# Patient Record
Sex: Female | Born: 1950 | Race: White | Hispanic: No | Marital: Married | Smoking: Former smoker
Health system: Southern US, Community
[De-identification: ages and names within clinical notes are randomized; demographics above are authoritative.]

## PROBLEM LIST (undated history)

## (undated) DIAGNOSIS — K219 Gastro-esophageal reflux disease without esophagitis: Secondary | ICD-10-CM

## (undated) DIAGNOSIS — N200 Calculus of kidney: Secondary | ICD-10-CM

## (undated) DIAGNOSIS — Z8719 Personal history of other diseases of the digestive system: Secondary | ICD-10-CM

## (undated) DIAGNOSIS — I219 Acute myocardial infarction, unspecified: Secondary | ICD-10-CM

## (undated) DIAGNOSIS — I2510 Atherosclerotic heart disease of native coronary artery without angina pectoris: Secondary | ICD-10-CM

## (undated) DIAGNOSIS — M1990 Unspecified osteoarthritis, unspecified site: Secondary | ICD-10-CM

## (undated) DIAGNOSIS — G4730 Sleep apnea, unspecified: Secondary | ICD-10-CM

## (undated) DIAGNOSIS — L409 Psoriasis, unspecified: Secondary | ICD-10-CM

## (undated) DIAGNOSIS — E7800 Pure hypercholesterolemia, unspecified: Secondary | ICD-10-CM

## (undated) DIAGNOSIS — E119 Type 2 diabetes mellitus without complications: Secondary | ICD-10-CM

## (undated) DIAGNOSIS — I10 Essential (primary) hypertension: Secondary | ICD-10-CM

## (undated) HISTORY — PX: CYSTECTOMY: SUR359

## (undated) HISTORY — PX: TONSILLECTOMY: SUR1361

---

## 1991-08-02 DIAGNOSIS — I219 Acute myocardial infarction, unspecified: Secondary | ICD-10-CM

## 1991-08-02 HISTORY — DX: Acute myocardial infarction, unspecified: I21.9

## 1991-08-02 HISTORY — PX: CORONARY ARTERY BYPASS GRAFT: SHX141

## 2012-05-15 ENCOUNTER — Encounter (HOSPITAL_COMMUNITY): Payer: Self-pay | Admitting: Pharmacy Technician

## 2012-05-15 ENCOUNTER — Other Ambulatory Visit: Payer: Self-pay

## 2012-05-15 ENCOUNTER — Encounter (HOSPITAL_COMMUNITY): Payer: Self-pay

## 2012-05-15 ENCOUNTER — Encounter (HOSPITAL_COMMUNITY)
Admission: RE | Admit: 2012-05-15 | Discharge: 2012-05-15 | Disposition: A | Discharge status: Home / Self Care | Payer: Self-pay | Source: Ambulatory Visit | Attending: Urology | Admitting: Urology

## 2012-05-15 HISTORY — DX: Atherosclerotic heart disease of native coronary artery without angina pectoris: I25.10

## 2012-05-15 HISTORY — DX: Psoriasis, unspecified: L40.9

## 2012-05-15 HISTORY — DX: Essential (primary) hypertension: I10

## 2012-05-15 HISTORY — DX: Gastro-esophageal reflux disease without esophagitis: K21.9

## 2012-05-15 HISTORY — DX: Acute myocardial infarction, unspecified: I21.9

## 2012-05-15 HISTORY — DX: Unspecified osteoarthritis, unspecified site: M19.90

## 2012-05-15 HISTORY — DX: Personal history of other diseases of the digestive system: Z87.19

## 2012-05-15 HISTORY — DX: Sleep apnea, unspecified: G47.30

## 2012-05-15 HISTORY — DX: Type 2 diabetes mellitus without complications: E11.9

## 2012-05-15 HISTORY — DX: Calculus of kidney: N20.0

## 2012-05-15 HISTORY — DX: Pure hypercholesterolemia, unspecified: E78.00

## 2012-05-15 LAB — BASIC METABOLIC PANEL
BUN: 23 mg/dL (ref 6–23)
CO2: 24 mEq/L (ref 19–32)
GFR calc non Af Amer: 65 mL/min — ABNORMAL LOW (ref >90)
Glucose, Bld: 129 mg/dL — ABNORMAL HIGH (ref 70–99)
Potassium: 4.4 mEq/L (ref 3.5–5.1)

## 2012-05-15 NOTE — Patient Instructions (Addendum)
[place]  05/15/2012   Your procedure is scheduled on:  05/16/2012  Report to [facility] at  76  AM.  Call this number if you have problems the morning of surgery: [phone]   Remember:   Do not eat food:After Midnight.  May have clear liquids:until Midnight .   Take these medicines the morning of surgery with A SIP OF WATER: voltaren,lisinopril,ditropan  Do not wear jewelry, make-up or nail polish.  Do not wear lotions, powders, or perfumes. You may wear deodorant.  Do not shave 48 hours prior to surgery. Men may shave face and neck.  Do not bring valuables to the hospital.  Contacts, dentures or bridgework may not be worn into surgery.  Leave suitcase in the car. After surgery it may be brought to your room.  For patients admitted to the hospital, checkout time is 11:00 AM the day of discharge.   Patients discharged the day of surgery will not be allowed to drive home.  Name and phone number of your driver: family Special Instructions: N/A   Please read over the following fact sheets that you were given: Pain Booklet, Surgical Site Infection Prevention, Anesthesia Post-op Instructions and Care and Recovery After Surgery Lithotripsy for Kidney Stones WHAT ARE KIDNEY STONES? The kidneys filter blood for chemicals the body cannot use. These waste chemicals are eliminated in the urine. They are removed from the body. Under some conditions, these chemicals may become concentrated. When this happens, they form crystals in the urine. When these crystals build up and stick together, stones may form. When these stones block the flow of urine through the urinary tract, they may cause severe pain. The urinary tract is very sensitive to blockage and stretching by the stone. WHAT IS LITHOTRIPSY? Lithotripsy is a treatment that can sometimes help eliminate kidney stones and pain faster. A form of lithotripsy, also known as ESWL (extracorporeal shock wave lithotripsy), is a nonsurgical  procedure that helps your body rid itself of the kidney stone with a minimum amount of pain. EWSL is a method of crushing a kidney stone with shock waves. These shock waves pass through your body. They cause the kidney stones to crumble while still in the urinary tract. It is then easier for the smaller pieces of stone to pass in the urine. Lithotripsy usually takes about an hour. It is done in a hospital, a lithotripsy center, or a mobile unit. It usually does not require an overnight stay. Your caregiver will instruct you on preparation for the procedure. Your caregiver will tell you what to expect afterward. LET YOUR CAREGIVER KNOW ABOUT:  Allergies.  Medicines taken including herbs, eye drops, over the counter medicines (including aspirin, aleve, or motrin for treatment of inflammatory conditions) and creams.  Use of steroids (by mouth or creams).  Previous problems with anesthetics or novocaine.  Possibility of pregnancy, if this applies.  History of blood clots (thrombophlebitis).  History of bleeding or blood problems.  Previous surgery.  Other health problems. RISKS AND COMPLICATIONS Complications of lithotripsy are uncommon, but include the following:  Infection.  Bleeding of the kidney.  Bruising of the kidney or skin.  Obstruction of the ureter (the passageway from the kidney to the bladder).  Failure of the stone to fragment (break apart). PROCEDURE A stent (flexible tube with holes) may be placed in your ureter. The ureter is the tube that transports the urine from the kidneys to the bladder. Your caregiver may place a stent before the  procedure. This will help keep urine flowing from the kidney if the fragments of the stone block the ureter. You may receive an intravenous (IV) line to give you fluids and medicines. These medicines may help you relax or make you sleep. During the procedure, you will lie comfortably on a fluid-filled cushion or in a warm-water bath.  After an x-ray or ultrasound locates your stone, shock waves are aimed at the stone. If you are awake, you may feel a tapping sensation (feeling) as the shock waves pass through your body. If large stone particles remain after treatment, a second procedure may be necessary at a later date. For comfort during the test:  Relax as much as possible.  Try to remain still as much as possible.  Try to follow instructions to speed up the test.  Let your caregiver know if you are uncomfortable, anxious, or in pain. AFTER THE PROCEDURE  After surgery, you will be taken to the recovery area. A nurse will watch and check your progress. Once you're awake, stable, and taking fluids well, you will be allowed to go home as long as there are no problems. You may be prescribed antibiotics (medicines that kill germs) to help prevent infection. You may also be prescribed pain medicine if needed. In a week or two, your doctor may remove your stent, if you have one. Your caregiver will check to see whether or not stone particles remain. PASSING THE STONE It may take anywhere from a day to several weeks for the stone particles to leave your body. During this time, drink at least 8 to 12 eight ounce glasses of water every day. It is normal for your urine to be cloudy or slightly bloody for a few weeks following this procedure. You may even see small pieces of stone in your urine. A slight fever and some pain are also normal. Your caregiver may ask you to strain your urine to collect some stone particles for chemical analysis. If you find particles while straining the urine, save them. Analysis tells you and the caregiver what the stone is made of. Knowing this may help prevent future stones. PREVENTING FUTURE STONES  Drink about 8 to 12, eight-ounce glasses of water every day.  Follow the diet your caregiver recommends.  Take your prescribed medicine.  See your caregiver regularly for checkups. SEEK IMMEDIATE MEDICAL  CARE IF:  You develop an oral temperature above 102 F (38.9 C), or as your caregiver suggests.  Your pain is not relieved by medicine.  You develop nausea (feeling sick to your stomach) and vomiting.  You develop heavy bleeding.  You have difficulty urinating. Document Released: 07/15/2000 Document Revised: 10/10/2011 Document Reviewed: 05/09/2008 [facility] Patient Information 2013 [place].

## 2012-05-16 ENCOUNTER — Ambulatory Visit (HOSPITAL_COMMUNITY)
Admission: RE | Admit: 2012-05-16 | Discharge: 2012-05-16 | Disposition: A | Discharge status: Home / Self Care | Payer: Self-pay | Source: Ambulatory Visit | Attending: Urology | Admitting: Urology

## 2012-05-16 ENCOUNTER — Ambulatory Visit (HOSPITAL_COMMUNITY): Payer: Self-pay

## 2012-05-16 ENCOUNTER — Encounter (HOSPITAL_COMMUNITY): Payer: Self-pay | Admitting: *Deleted

## 2012-05-16 ENCOUNTER — Encounter (HOSPITAL_COMMUNITY)
Admission: RE | Disposition: A | Discharge status: Home / Self Care | Payer: Self-pay | Source: Ambulatory Visit | Attending: Urology

## 2012-05-16 DIAGNOSIS — E119 Type 2 diabetes mellitus without complications: Secondary | ICD-10-CM | POA: Insufficient documentation

## 2012-05-16 DIAGNOSIS — N200 Calculus of kidney: Secondary | ICD-10-CM | POA: Insufficient documentation

## 2012-05-16 DIAGNOSIS — I10 Essential (primary) hypertension: Secondary | ICD-10-CM | POA: Insufficient documentation

## 2012-05-16 HISTORY — PX: EXTRACORPOREAL SHOCK WAVE LITHOTRIPSY: SHX1557

## 2012-05-16 LAB — GLUCOSE, CAPILLARY: Glucose-Capillary: 94 mg/dL (ref 70–99)

## 2012-05-16 SURGERY — LITHOTRIPSY, ESWL
Anesthesia: Moderate Sedation | Laterality: Left

## 2012-05-16 MED ORDER — DIPHENHYDRAMINE HCL 25 MG PO CAPS
ORAL_CAPSULE | ORAL | Status: AC
  Filled 2012-05-16: qty 1

## 2012-05-16 MED ORDER — DIAZEPAM 5 MG PO TABS
ORAL_TABLET | ORAL | Status: AC
Start: 2012-05-16 — End: 2012-05-16
  Filled 2012-05-16: qty 2

## 2012-05-16 MED ORDER — DIPHENHYDRAMINE HCL 25 MG PO CAPS
25.0000 mg | ORAL_CAPSULE | Freq: Once | ORAL | Status: AC
  Administered 2012-05-16: 25 mg via ORAL

## 2012-05-16 MED ORDER — DIAZEPAM 5 MG PO TABS
10.0000 mg | ORAL_TABLET | Freq: Once | ORAL | Status: AC
  Administered 2012-05-16: 10 mg via ORAL

## 2012-05-16 SURGICAL SUPPLY — 3 items
CLOTH BEACON ORANGE TIMEOUT ST (SAFETY) IMPLANT
GOWN STRL REIN XL XLG (GOWN DISPOSABLE) IMPLANT
TOWEL OR 17X26 4PK STRL BLUE (TOWEL DISPOSABLE) IMPLANT

## 2012-05-16 NOTE — H&P (Signed)
NAME:  Jasmine Hansen, Jasmine Hansen NO.:  [id number]  MEDICAL RECORD NO.:  [id number]  LOCATION:                                 FACILITY:  PHYSICIAN:  Marissa Nestle, M.D.DATE OF BIRTH:  21-Sep-1950  DATE OF ADMISSION:  05/16/2012 DATE OF DISCHARGE:  LH                             HISTORY & PHYSICAL   CHIEF COMPLAINT:  Left renal colic.  HISTORY OF PRESENT ILLNESS:  A 61 year old female who was recently seen in [facility] with left renal colic.  She had a large stone almost 2 cm in the left renal pelvis, which was radiolucent, causing no obstruction and she had positive urine culture.  She was treated with antibiotic, sent home.  She is coming as outpatient today to have ESL done.  I will fluoro her and see if we can see the stone.  If not, then we may have to use the contrast to see the stone.  She does have a very small calculus in the lower pole of calix on the right side, which is not causing any obstruction or any pain.  PAST MEDICAL HISTORY:  History of coronary artery disease, non-insulin dependent diabetes, hypertension, hyperlipidemia, and sleep apnea, she is on CPAP, osteoarthritis, and morbid obesity.  PAST SURGICAL HISTORY:  Includes CABG and tonsillectomy.  Had open heart surgery in 1992.  MEDICATIONS:  She is taking lisinopril, metformin, glipizide, Ritalin, aspirin, oxybutynin.  PERSONAL HISTORY:  Denies any use of alcohol or tobacco.  REVIEW OF SYSTEMS:  Unremarkable.  She has chronic urgency and marked stress incontinence.  Otherwise, unremarkable.  PHYSICAL EXAMINATION:  VITAL SIGNS:  Blood pressure is 147/79, temperature is normal. CENTRAL NERVOUS SYSTEM:  No gross neurological deficit. HEAD, NECK, EYES, ENT:  Negative. CHEST:  Clear. HEART:  Regular sinus rhythm. ABDOMEN:  Soft, flat.  Liver, spleen, kidneys not palpable.  No CVA tenderness. PELVIC:  No adnexal mass or tenderness.  IMPRESSION AND PLAN:  Left renal calculus.  A  tiny right renal calculus. ESL, left renal calculus.  No guarantees about the results.  It depends if I can see the stone.  If unable to see the stone, we may have to cancel the surgery.     Marissa Nestle, M.D.     MIJ/MEDQ  D:  05/16/2012  T:  05/16/2012  Job:  [id number]

## 2012-05-16 NOTE — Progress Notes (Signed)
No change in H&P on reexamination. 

## 2012-05-17 ENCOUNTER — Encounter (HOSPITAL_COMMUNITY): Payer: Self-pay | Admitting: Urology

## 2018-09-18 DIAGNOSIS — I5033 Acute on chronic diastolic (congestive) heart failure: Secondary | ICD-10-CM | POA: Insufficient documentation

## 2018-09-18 DIAGNOSIS — N3000 Acute cystitis without hematuria: Secondary | ICD-10-CM | POA: Insufficient documentation

## 2018-09-18 DIAGNOSIS — N179 Acute kidney failure, unspecified: Secondary | ICD-10-CM | POA: Insufficient documentation

## 2018-09-18 DIAGNOSIS — R768 Other specified abnormal immunological findings in serum: Secondary | ICD-10-CM | POA: Insufficient documentation

## 2018-09-18 DIAGNOSIS — R9431 Abnormal electrocardiogram [ECG] [EKG]: Secondary | ICD-10-CM | POA: Insufficient documentation

## 2018-12-10 ENCOUNTER — Other Ambulatory Visit: Payer: Self-pay | Admitting: Orthopedic Surgery

## 2018-12-10 DIAGNOSIS — M545 Low back pain, unspecified: Secondary | ICD-10-CM

## 2018-12-17 ENCOUNTER — Ambulatory Visit
Admission: RE | Admit: 2018-12-17 | Discharge: 2018-12-17 | Disposition: A | Discharge status: Home / Self Care | Payer: Medicare HMO | Source: Ambulatory Visit | Attending: Orthopedic Surgery | Admitting: Orthopedic Surgery

## 2018-12-17 ENCOUNTER — Other Ambulatory Visit: Payer: Self-pay

## 2018-12-17 DIAGNOSIS — M545 Low back pain, unspecified: Secondary | ICD-10-CM

## 2019-01-01 ENCOUNTER — Other Ambulatory Visit: Payer: Self-pay | Admitting: Orthopedic Surgery

## 2019-01-01 DIAGNOSIS — M545 Low back pain, unspecified: Secondary | ICD-10-CM

## 2019-01-15 ENCOUNTER — Other Ambulatory Visit: Payer: Medicare HMO

## 2019-02-19 DIAGNOSIS — I2510 Atherosclerotic heart disease of native coronary artery without angina pectoris: Secondary | ICD-10-CM | POA: Insufficient documentation

## 2019-02-19 DIAGNOSIS — G4733 Obstructive sleep apnea (adult) (pediatric): Secondary | ICD-10-CM | POA: Insufficient documentation

## 2019-02-19 DIAGNOSIS — N184 Chronic kidney disease, stage 4 (severe): Secondary | ICD-10-CM | POA: Insufficient documentation

## 2019-02-19 DIAGNOSIS — I10 Essential (primary) hypertension: Secondary | ICD-10-CM | POA: Insufficient documentation

## 2019-02-19 DIAGNOSIS — I4891 Unspecified atrial fibrillation: Secondary | ICD-10-CM | POA: Insufficient documentation

## 2019-02-19 DIAGNOSIS — E1121 Type 2 diabetes mellitus with diabetic nephropathy: Secondary | ICD-10-CM | POA: Insufficient documentation

## 2019-04-11 ENCOUNTER — Other Ambulatory Visit: Payer: Self-pay | Admitting: Physical Medicine and Rehabilitation

## 2019-04-11 DIAGNOSIS — M545 Low back pain, unspecified: Secondary | ICD-10-CM

## 2019-04-11 DIAGNOSIS — G8929 Other chronic pain: Secondary | ICD-10-CM

## 2019-05-31 ENCOUNTER — Other Ambulatory Visit: Payer: Self-pay | Admitting: Physical Medicine and Rehabilitation

## 2019-05-31 ENCOUNTER — Ambulatory Visit
Admission: RE | Admit: 2019-05-31 | Discharge: 2019-05-31 | Disposition: A | Discharge status: Home / Self Care | Payer: Medicare HMO | Source: Ambulatory Visit | Attending: Physical Medicine and Rehabilitation | Admitting: Physical Medicine and Rehabilitation

## 2019-05-31 ENCOUNTER — Other Ambulatory Visit: Payer: Self-pay

## 2019-05-31 DIAGNOSIS — M545 Low back pain, unspecified: Secondary | ICD-10-CM

## 2019-05-31 DIAGNOSIS — G8929 Other chronic pain: Secondary | ICD-10-CM

## 2019-05-31 MED ORDER — METHYLPREDNISOLONE ACETATE 40 MG/ML INJ SUSP (RADIOLOG
120.0000 mg | Freq: Once | INTRAMUSCULAR | Status: AC
  Administered 2019-05-31: 120 mg via EPIDURAL

## 2019-05-31 MED ORDER — IOPAMIDOL (ISOVUE-M 200) INJECTION 41%
1.0000 mL | Freq: Once | INTRAMUSCULAR | Status: AC
  Administered 2019-05-31: 1 mL via EPIDURAL

## 2019-05-31 NOTE — Discharge Instructions (Signed)

## 2019-07-09 ENCOUNTER — Other Ambulatory Visit: Payer: Self-pay | Admitting: Physical Medicine and Rehabilitation

## 2019-07-09 DIAGNOSIS — M5416 Radiculopathy, lumbar region: Secondary | ICD-10-CM

## 2019-08-28 ENCOUNTER — Other Ambulatory Visit: Payer: Medicare HMO

## 2019-11-28 ENCOUNTER — Other Ambulatory Visit: Payer: Self-pay

## 2019-11-28 ENCOUNTER — Ambulatory Visit
Admission: RE | Admit: 2019-11-28 | Discharge: 2019-11-28 | Disposition: A | Discharge status: Home / Self Care | Payer: Medicare HMO | Source: Ambulatory Visit | Attending: Physical Medicine and Rehabilitation | Admitting: Physical Medicine and Rehabilitation

## 2019-11-28 DIAGNOSIS — M5416 Radiculopathy, lumbar region: Secondary | ICD-10-CM

## 2019-11-28 MED ORDER — IOPAMIDOL (ISOVUE-M 200) INJECTION 41%
1.0000 mL | Freq: Once | INTRAMUSCULAR | Status: AC
  Administered 2019-11-28: 1 mL via EPIDURAL

## 2019-11-28 MED ORDER — METHYLPREDNISOLONE ACETATE 40 MG/ML INJ SUSP (RADIOLOG
120.0000 mg | Freq: Once | INTRAMUSCULAR | Status: AC
  Administered 2019-11-28: 120 mg via EPIDURAL

## 2019-11-28 NOTE — Discharge Instructions (Signed)
Spinal Injection Discharge Instruction Sheet  1. You may resume a regular diet and any medications that you routinely take, including pain medications.  2. No driving the rest of the day of the procedure.  3. Light activity throughout the rest of the day.  Do not do any strenuous work, exercise, bending or lifting.  The day following the procedure, you may resume normal physical activity but you should refrain from exercising or physical therapy for at least three days.   Common Side Effects:   Headaches- take your usual medications as directed by your physician.     Restlessness or inability to sleep- you may have trouble sleeping for the next few days.  Ask your referring physician if you need any medication for sleep if over the counter sleep medications do not help.   Facial flushing or redness- this should subside within a few days.   Increased pain- a temporary increase in pain a day or two following your procedure is not unusual.  Take your pain medication as prescribed by your referring physician.  You may use ice to the injection site as needed.  Please do not use heat for 24 hours.   Leg cramps  Please contact our office at [phone] for the following symptoms:  Fever greater than 100 degrees.  Headaches unresolved with medication after 2-3 days.  Increased swelling, pain, or redness at injection site.  Thank you for visiting our office.   You may resume Warfarin in 12 hours.

## 2020-03-10 ENCOUNTER — Other Ambulatory Visit: Payer: Self-pay | Admitting: Physical Medicine and Rehabilitation

## 2020-03-10 DIAGNOSIS — M5136 Other intervertebral disc degeneration, lumbar region: Secondary | ICD-10-CM

## 2020-04-15 ENCOUNTER — Other Ambulatory Visit: Payer: Self-pay | Admitting: Physical Medicine and Rehabilitation

## 2020-04-15 ENCOUNTER — Ambulatory Visit
Admission: RE | Admit: 2020-04-15 | Discharge: 2020-04-15 | Disposition: A | Discharge status: Home / Self Care | Payer: Medicare HMO | Source: Ambulatory Visit | Attending: Physical Medicine and Rehabilitation | Admitting: Physical Medicine and Rehabilitation

## 2020-04-15 ENCOUNTER — Other Ambulatory Visit: Payer: Self-pay

## 2020-04-15 DIAGNOSIS — M5136 Other intervertebral disc degeneration, lumbar region: Secondary | ICD-10-CM

## 2020-04-15 DIAGNOSIS — M51369 Other intervertebral disc degeneration, lumbar region without mention of lumbar back pain or lower extremity pain: Secondary | ICD-10-CM

## 2020-04-15 MED ORDER — IOPAMIDOL (ISOVUE-M 200) INJECTION 41%
1.0000 mL | Freq: Once | INTRAMUSCULAR | Status: AC
  Administered 2020-04-15: 1 mL via EPIDURAL

## 2020-04-20 ENCOUNTER — Ambulatory Visit
Admission: RE | Admit: 2020-04-20 | Discharge: 2020-04-20 | Disposition: A | Discharge status: Home / Self Care | Payer: Medicare HMO | Source: Ambulatory Visit | Attending: Physical Medicine and Rehabilitation | Admitting: Physical Medicine and Rehabilitation

## 2020-04-20 ENCOUNTER — Other Ambulatory Visit: Payer: Self-pay | Admitting: Physical Medicine and Rehabilitation

## 2020-04-20 ENCOUNTER — Other Ambulatory Visit: Payer: Self-pay

## 2020-04-20 DIAGNOSIS — M5136 Other intervertebral disc degeneration, lumbar region: Secondary | ICD-10-CM

## 2020-04-20 MED ORDER — METHYLPREDNISOLONE ACETATE 40 MG/ML INJ SUSP (RADIOLOG
120.0000 mg | Freq: Once | INTRAMUSCULAR | Status: AC
  Administered 2020-04-20: 120 mg via EPIDURAL

## 2020-04-20 MED ORDER — IOPAMIDOL (ISOVUE-M 200) INJECTION 41%
1.0000 mL | Freq: Once | INTRAMUSCULAR | Status: AC
  Administered 2020-04-20: 1 mL via EPIDURAL

## 2020-04-20 NOTE — Discharge Instructions (Signed)

## 2021-01-12 IMAGING — XA Imaging study
3 series · 3 of 3 positions shown · non-contrast
Comparison: none

CLINICAL DATA: Lumbosacral spondylosis without myelopathy.
Bilateral low back pain and right lower extremity pain. Good
response to a right L5 nerve root block in [DATE] but with much
less improvement following a repeat injection in [DATE]. Specific
request for an L5-S1 interlaminar injection today.

[Series 9: ortho adipose · 1 of 1 slices shown (1 of 3)]
[im 1/1]
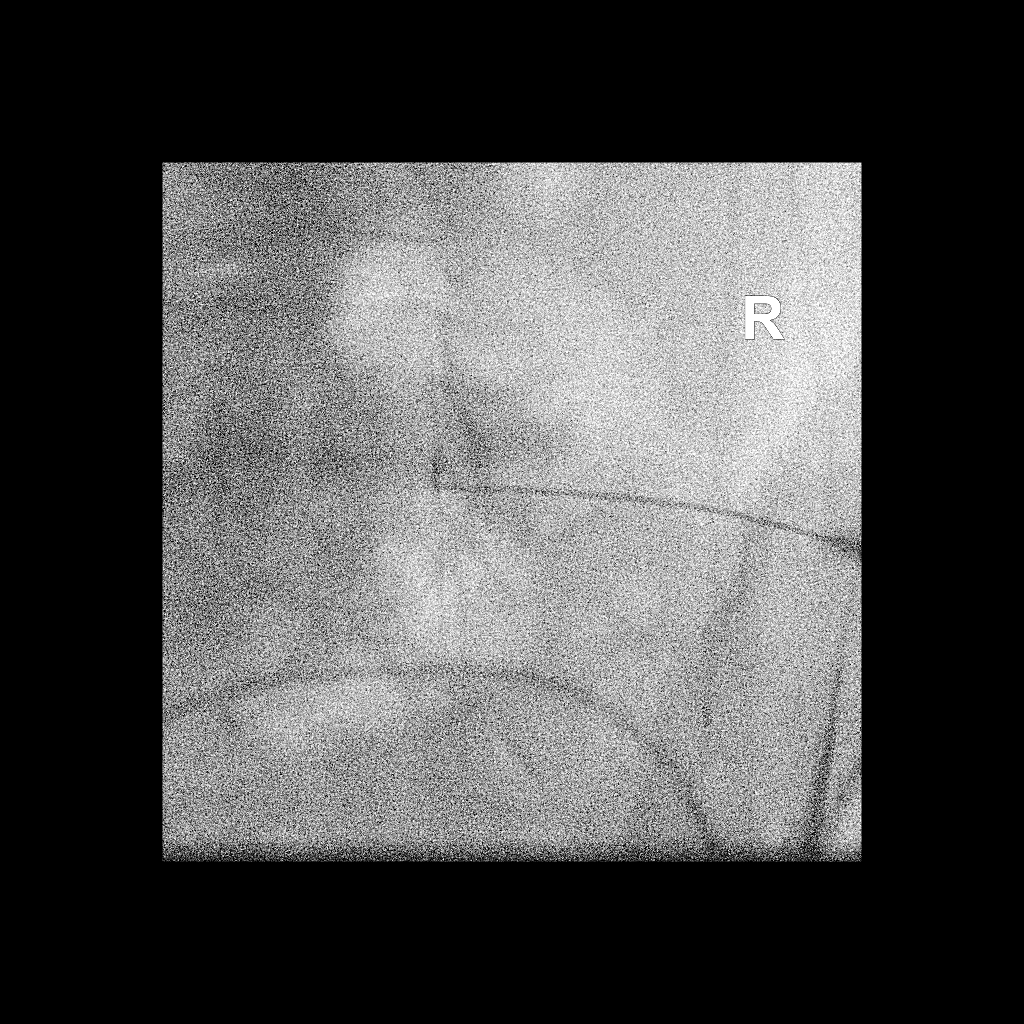

[Series 11: ortho adipose · 1 of 1 slices shown (2 of 3)]
[im 1/1]
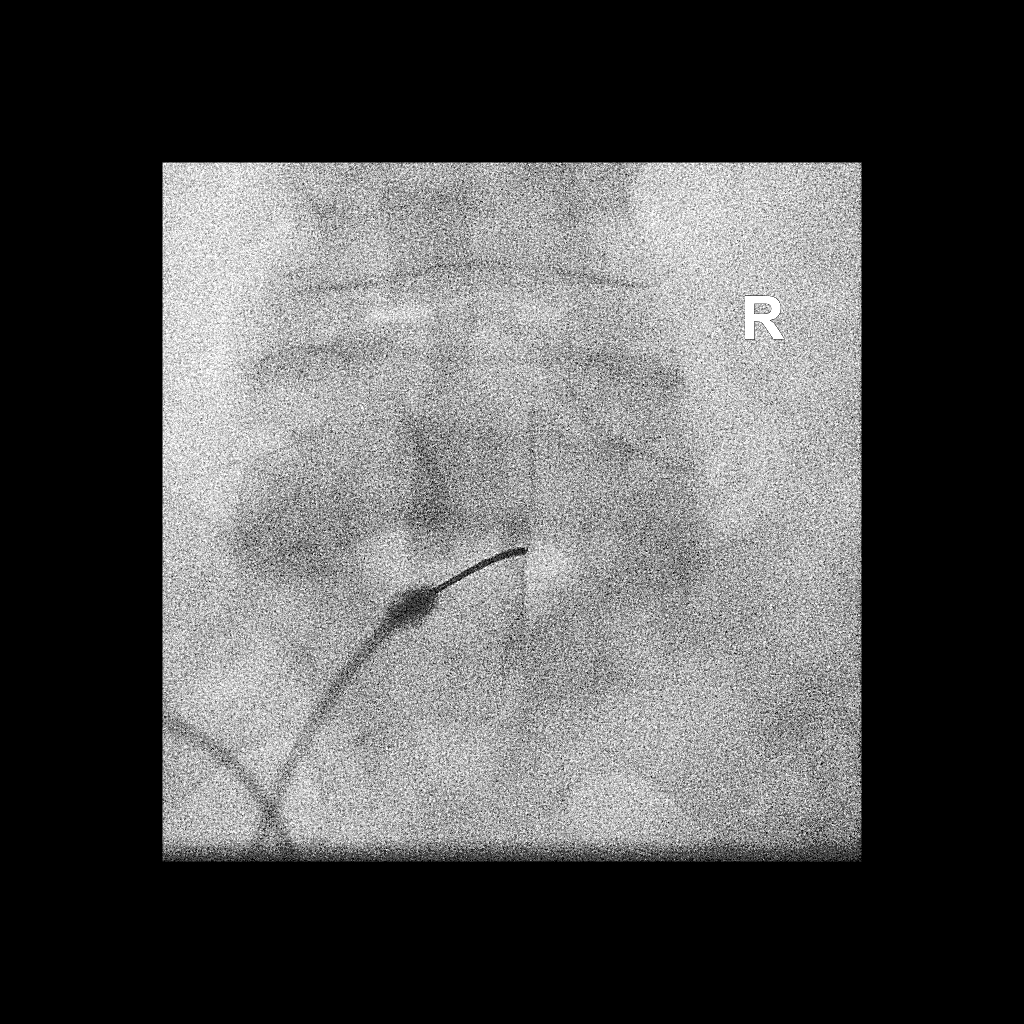

[Series 12: ortho adipose · 1 of 1 slices shown (3 of 3)]
[im 1/1]
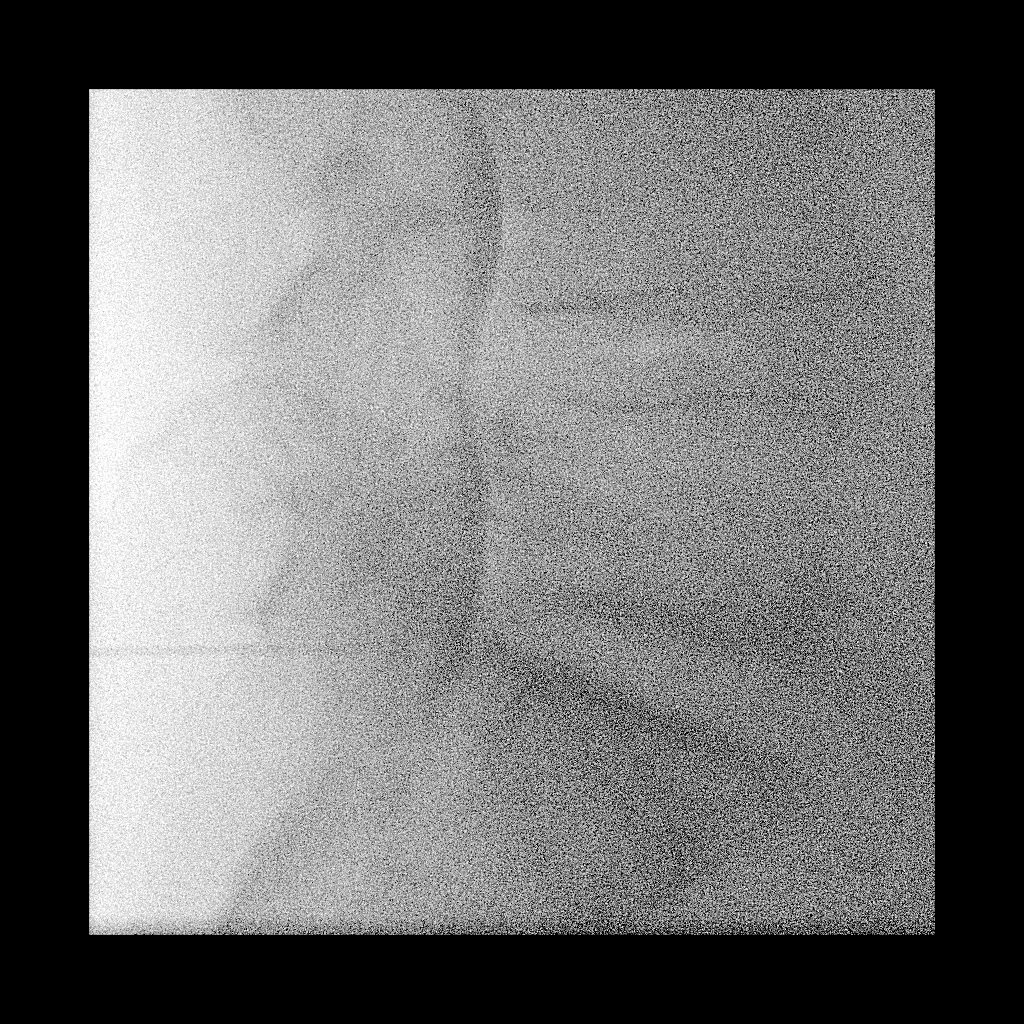

[3 of 3 positions shown; findings below may reference images not displayed]

FLUOROSCOPY TIME:  Fluoroscopy Time: 19 seconds

Radiation Exposure Index: 58.36 microGray*m^2

PROCEDURE:
The procedure, risks, benefits, and alternatives were explained to
the patient. Questions regarding the procedure were encouraged and
answered. The patient understands and consents to the procedure.

LUMBAR EPIDURAL INJECTION:

An interlaminar approach was performed on the right at L5-S1. The
overlying skin was cleansed and anesthetized. A 6 inch 20 gauge
epidural needle was advanced using loss-of-resistance technique.

DIAGNOSTIC EPIDURAL INJECTION:

MRI demonstrates some epidural fat far laterally in the spinal canal
at L5-S1 but no dorsal epidural fat in the paramedian regions. Given
this, an approach further off midline was used than is typically
employed, however despite directing the needle more laterally in the
interlaminar space the dura was still punctured. CSF was visible in
the needle hub and injection of a small amount of Isovue-M 200
demonstrated subarachnoid contrast spread. The needle was removed
and the procedure was aborted. The complication was explained to the
patient and her daughter with instructions given for 24 hours of
bedrest.

COMPLICATIONS:
Inadvertent dural puncture
IMPRESSION: Inadvertent dural puncture during attempted interlaminar epidural
injection at L5-S1.

The patient will be brought back for a repeat injection once the
dural puncture has healed. We could consider a repeat attempt at an
interlaminar injection (potentially at a different level), a caudal
epidural injection, or returning to a transforaminal approach.

## 2021-12-30 DEATH — deceased
